# Patient Record
Sex: Female | Born: 1950 | Race: Black or African American | Hispanic: No | Marital: Single | Smoking: Never smoker
Health system: Southern US, Community
[De-identification: ages and names within clinical notes are randomized; demographics above are authoritative.]

## PROBLEM LIST (undated history)

## (undated) DIAGNOSIS — I10 Essential (primary) hypertension: Secondary | ICD-10-CM

## (undated) DIAGNOSIS — E876 Hypokalemia: Secondary | ICD-10-CM

---

## 2005-03-25 ENCOUNTER — Emergency Department (HOSPITAL_COMMUNITY): Admission: EM | Admit: 2005-03-25 | Discharge: 2005-03-25 | Payer: Self-pay | Admitting: Emergency Medicine

## 2005-12-05 IMAGING — CR DG CHEST 2V
2 series · 2 of 2 positions shown · non-contrast
Comparison: None.

CLINICAL DATA: 54-year-old with chills and dizziness.
 CHEST - 2 VIEW:

[view not recorded (1 of 2)]
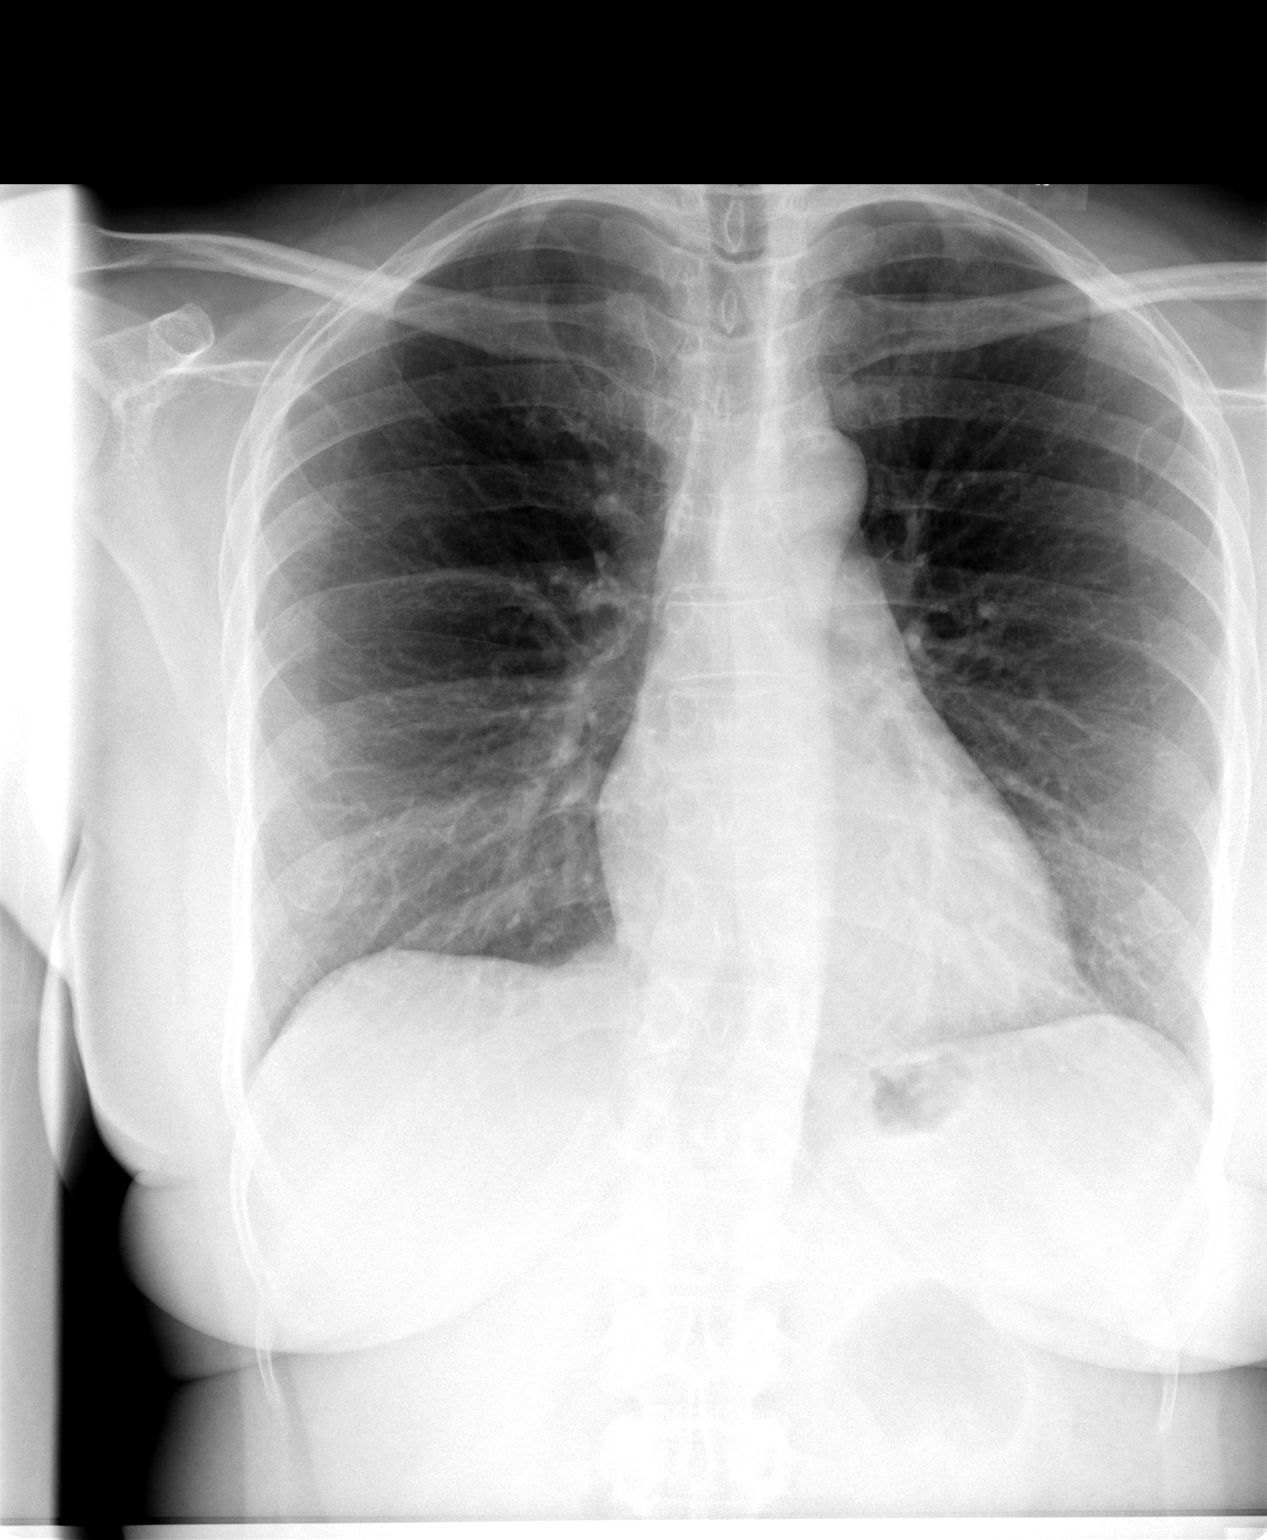

[view not recorded (2 of 2)]
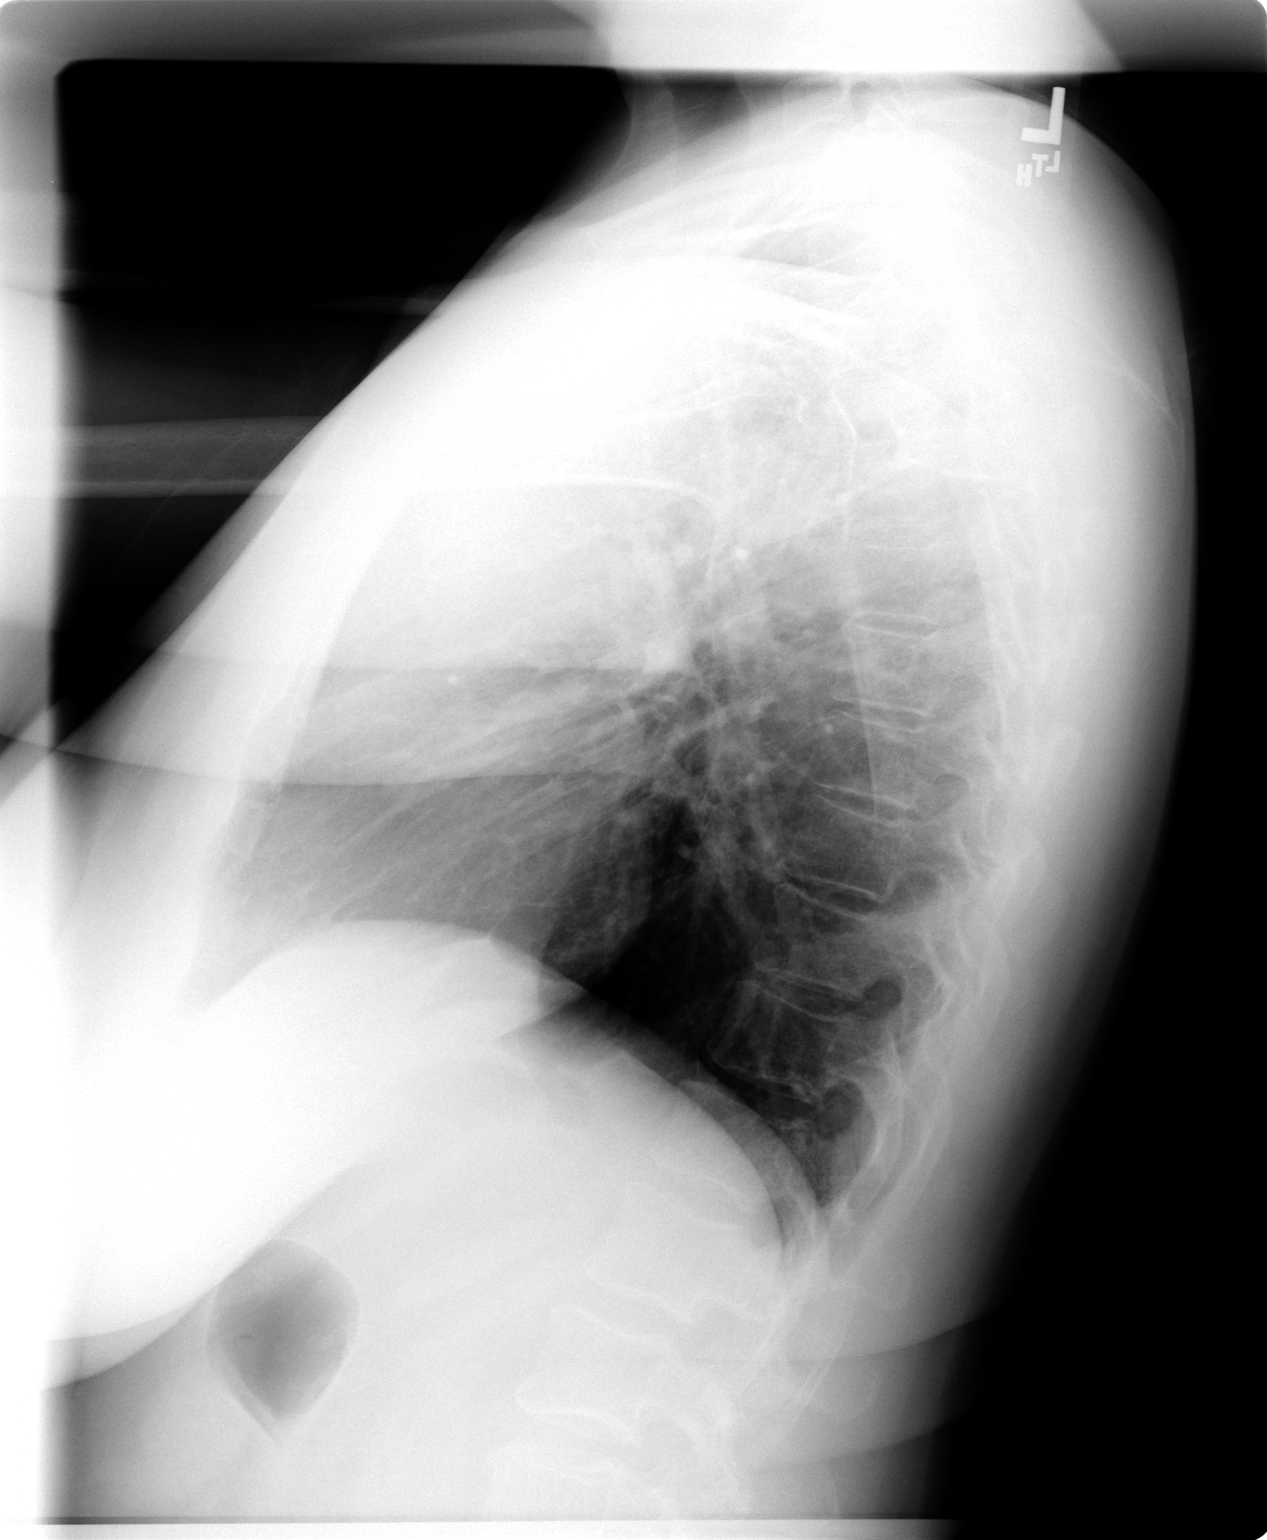

[2 of 2 positions shown; findings below may reference images not displayed]

Cardiomediastinal silhouette is within normal limits.  Lungs are free of focal consolidations and pleural effusions.  Images of the upper abdomen are unremarkable.
IMPRESSION: No evidence for acute abnormality.

## 2020-09-30 ENCOUNTER — Other Ambulatory Visit: Payer: Self-pay

## 2022-09-25 ENCOUNTER — Encounter: Payer: Self-pay | Admitting: Emergency Medicine

## 2022-09-25 ENCOUNTER — Ambulatory Visit
Admission: EM | Admit: 2022-09-25 | Discharge: 2022-09-25 | Disposition: A | Discharge status: Home / Self Care | Payer: Medicare PPO

## 2022-09-25 DIAGNOSIS — H6593 Unspecified nonsuppurative otitis media, bilateral: Secondary | ICD-10-CM

## 2022-09-25 DIAGNOSIS — J069 Acute upper respiratory infection, unspecified: Secondary | ICD-10-CM

## 2022-09-25 DIAGNOSIS — R030 Elevated blood-pressure reading, without diagnosis of hypertension: Secondary | ICD-10-CM | POA: Diagnosis not present

## 2022-09-25 DIAGNOSIS — R42 Dizziness and giddiness: Secondary | ICD-10-CM

## 2022-09-25 HISTORY — DX: Essential (primary) hypertension: I10

## 2022-09-25 HISTORY — DX: Hypokalemia: E87.6

## 2022-09-25 MED ORDER — FLUTICASONE PROPIONATE 50 MCG/ACT NA SUSP: 1.0000 | Freq: Two times a day (BID) | NASAL | 2 refills | Status: AC

## 2022-09-25 MED ORDER — MECLIZINE HCL 25 MG PO TABS: 25.0000 mg | ORAL_TABLET | Freq: Three times a day (TID) | ORAL | 0 refills | Status: AC | PRN

## 2022-09-25 NOTE — ED Triage Notes (Addendum)
Pt reports waking up this morning feeling dizzy. States the dizziness is worse when lying down. Also reports chills last night, and nasal congestion and cough all week. Denies headaches, vision changes and weakness affecting only one side of the body.

## 2022-09-25 NOTE — ED Provider Notes (Signed)
[facility]    CSN: [id number] Arrival date & time: 09/25/22  4315      History   Chief Complaint Chief Complaint  Patient presents with   Dizziness   Fatigue   Chills   Nasal Congestion    HPI Ashley Barry is a 72 y.o. female.   Patient presenting today with about a week of resolving cough, congestion, fatigue and now since yesterday has been having chills, worsening congestion and started this morning when she got up out of bed with room spinning dizziness.  States when she is at rest the dizziness resolves but if she moves her head or changes positions she starts having the room spinning sensation.  She denies any nausea, vomiting, chest pain, shortness of breath, palpitations, visual change, mental status changes, weakness.  She has been taking some naturopathic cold tablets and Tylenol with mild relief of her symptoms.  History of hypertension, and states she has had vertigo once before with an ear infection.    Past Medical History:  Diagnosis Date   Hypertension    Hypokalemia     There are no problems to display for this patient.   History reviewed. No pertinent surgical history.  OB History   No obstetric history on file.      Home Medications    Prior to Admission medications   Medication Sig Start Date End Date Taking? Authorizing Provider  fluticasone (FLONASE) 50 MCG/ACT nasal spray Place 1 spray into both nostrils 2 (two) times daily. 09/25/22  Yes Volney American, PA-C  hydrochlorothiazide (HYDRODIURIL) 25 MG tablet Take 25 mg by mouth. 07/06/22 10/04/22 Yes [provider]  meclizine (ANTIVERT) 25 MG tablet Take 1 tablet (25 mg total) by mouth 3 (three) times daily as needed for dizziness. May cause drowsiness 09/25/22  Yes Volney American, PA-C  potassium chloride (KLOR-CON M10) 10 MEQ tablet Take 1 tablet by mouth daily. 11/07/21  Yes [provider]  Study - CAPTIVA - aspirin 81 mg tablet (PI-Sethi) Chew 1  tablet by mouth daily. Every other day 05/20/10  Yes [provider]    Family History History reviewed. No pertinent family history.  Social History Social History   Tobacco Use   Smoking status: Never   Smokeless tobacco: Never     Allergies   Patient has no known allergies.   Review of Systems Review of Systems PER HPI  Physical Exam Triage Vital Signs ED Triage Vitals  Enc Vitals Group     BP 09/25/22 1030 (!) 201/91     Pulse Rate 09/25/22 1030 80     Resp 09/25/22 1030 17     Temp 09/25/22 1030 99.1 F (37.3 C)     Temp Source 09/25/22 1030 Oral     SpO2 09/25/22 1030 98 %     Weight --      Height --      Head Circumference --      Peak Flow --      Pain Score 09/25/22 1027 0     Pain Loc --      Pain Edu? --      Excl. in [place]? --    Orthostatic VS for the past 24 hrs:  BP- Lying Pulse- Lying BP- Sitting Pulse- Sitting BP- Standing at 0 minutes Pulse- Standing at 0 minutes  09/25/22 1109 187/90 84 174/88 77 185/88 82    Updated Vital Signs BP (!) 201/91 (BP Location: Right Arm)   Pulse 80  Temp 99.1 F (37.3 C) (Oral)   Resp 17   SpO2 98%   Visual Acuity Right Eye Distance:   Left Eye Distance:   Bilateral Distance:    Right Eye Near:   Left Eye Near:    Bilateral Near:     Physical Exam Vitals and nursing note reviewed.  Constitutional:      Appearance: Normal appearance. She is not ill-appearing.  HENT:     Head: Atraumatic.     Ears:     Comments: B/l middle ear effusion    Nose: Rhinorrhea present.     Mouth/Throat:     Mouth: Mucous membranes are moist.     Pharynx: Oropharynx is clear. Posterior oropharyngeal erythema present.  Eyes:     Extraocular Movements: Extraocular movements intact.     Conjunctiva/sclera: Conjunctivae normal.  Cardiovascular:     Rate and Rhythm: Normal rate and regular rhythm.     Heart sounds: Normal heart sounds.  Pulmonary:     Effort: Pulmonary effort is normal.     Breath sounds:  Normal breath sounds.  Musculoskeletal:        General: Normal range of motion.     Cervical back: Normal range of motion and neck supple.  Skin:    General: Skin is warm and dry.  Neurological:     General: No focal deficit present.     Mental Status: She is alert and oriented to person, place, and time.     Cranial Nerves: No cranial nerve deficit.     Motor: No weakness.     Gait: Gait normal.  Psychiatric:        Mood and Affect: Mood normal.        Thought Content: Thought content normal.        Judgment: Judgment normal.      UC Treatments / Results  Labs (all labs ordered are listed, but only abnormal results are displayed) Labs Reviewed - No data to display  EKG   Radiology No results found.  Procedures Procedures (including critical care time)  Medications Ordered in UC Medications - No data to display  Initial Impression / Assessment and Plan / UC Course  I have reviewed the triage vital signs and the nursing notes.  Pertinent labs & imaging results that were available during my care of the patient were reviewed by me and considered in my medical decision making (see chart for details).     Her dizziness is most consistent with vertigo, possibly spurred by middle ear effusion secondary to viral respiratory infection.  Her orthostatic vital signs are reassuring today, however her blood pressure remains significantly elevated throughout her time in clinic so she is instructed to monitor this closely and follow-up with PCP if not improving.  We will treat with meclizine for the vertigo, Flonase for the middle ear effusion and discussed supportive over-the-counter medications, home care and Epley maneuver.  Return for worsening symptoms.  Final Clinical Impressions(s) / UC Diagnoses   Final diagnoses:  Elevated blood pressure reading  Vertigo  Middle ear effusion, bilateral  Viral URI with cough     Discharge Instructions      I believe your dizziness  is coming from positional vertigo.  The meclizine should help as needed and I have also attached a handout on home maneuvers that may help.  This could be being triggered by some fluid pressure in your ears from your recent cold, you may use Coricidin HBP and Flonase twice  daily to help reduce the pressure in your inner ears.  Keep a close eye on your blood pressures as they are significantly elevated today, hopefully these come down over the next day or so but follow-up with your primary care provider if you have any unresolving symptoms    ED Prescriptions     Medication Sig Dispense Auth. Provider   meclizine (ANTIVERT) 25 MG tablet Take 1 tablet (25 mg total) by mouth 3 (three) times daily as needed for dizziness. May cause drowsiness 15 tablet Volney American, PA-C   fluticasone [facility]) 50 MCG/ACT nasal spray Place 1 spray into both nostrils 2 (two) times daily. 16 g Volney American, [place]      PDMP not reviewed this encounter.   Volney American, [place] 09/25/22 2008

## 2022-09-25 NOTE — Discharge Instructions (Signed)
I believe your dizziness is coming from positional vertigo.  The meclizine should help as needed and I have also attached a handout on home maneuvers that may help.  This could be being triggered by some fluid pressure in your ears from your recent cold, you may use Coricidin HBP and Flonase twice daily to help reduce the pressure in your inner ears.  Keep a close eye on your blood pressures as they are significantly elevated today, hopefully these come down over the next day or so but follow-up with your primary care provider if you have any unresolving symptoms

## 2022-09-27 ENCOUNTER — Encounter (HOSPITAL_COMMUNITY): Payer: Self-pay

## 2022-09-27 ENCOUNTER — Emergency Department (HOSPITAL_COMMUNITY)
Admission: EM | Admit: 2022-09-27 | Discharge: 2022-09-28 | Disposition: A | Discharge status: Home / Self Care | Payer: Medicare PPO | Attending: Emergency Medicine | Admitting: Emergency Medicine

## 2022-09-27 DIAGNOSIS — R42 Dizziness and giddiness: Secondary | ICD-10-CM | POA: Diagnosis present

## 2022-09-27 DIAGNOSIS — H8110 Benign paroxysmal vertigo, unspecified ear: Secondary | ICD-10-CM | POA: Diagnosis not present

## 2022-09-27 DIAGNOSIS — I10 Essential (primary) hypertension: Secondary | ICD-10-CM | POA: Diagnosis not present

## 2022-09-27 DIAGNOSIS — Z79899 Other long term (current) drug therapy: Secondary | ICD-10-CM | POA: Diagnosis not present

## 2022-09-27 LAB — BASIC METABOLIC PANEL
Anion gap: 11 (ref 5–15)
BUN: 14 mg/dL (ref 8–23)
CO2: 28 mmol/L (ref 22–32)
Calcium: 9.1 mg/dL (ref 8.9–10.3)
Chloride: 96 mmol/L — ABNORMAL LOW (ref 98–111)
Creatinine, Ser: 1 mg/dL (ref 0.44–1.00)
GFR, Estimated: >60 mL/min (ref >60)
Glucose, Bld: 135 mg/dL — ABNORMAL HIGH (ref 70–99)
Potassium: 3 mmol/L — ABNORMAL LOW (ref 3.5–5.1)
Sodium: 135 mmol/L (ref 135–145)

## 2022-09-27 LAB — CBC WITH DIFFERENTIAL/PLATELET
Abs Immature Granulocytes: 0.01 10*3/uL (ref 0.00–0.07)
Basophils Absolute: 0 10*3/uL (ref 0.0–0.1)
Basophils Relative: 0 %
Eosinophils Absolute: 0 10*3/uL (ref 0.0–0.5)
Eosinophils Relative: 0 %
HCT: 40.7 % (ref 36.0–46.0)
Hemoglobin: 13.2 g/dL (ref 12.0–15.0)
Immature Granulocytes: 0 %
Lymphocytes Relative: 16 %
Lymphs Abs: 0.9 10*3/uL (ref 0.7–4.0)
MCH: 29.1 pg (ref 26.0–34.0)
MCHC: 32.4 g/dL (ref 30.0–36.0)
MCV: 89.6 fL (ref 80.0–100.0)
Monocytes Absolute: 0.4 10*3/uL (ref 0.1–1.0)
Monocytes Relative: 6 %
Neutro Abs: 4.5 10*3/uL (ref 1.7–7.7)
Neutrophils Relative %: 78 %
Platelets: 304 10*3/uL (ref 150–400)
RBC: 4.54 MIL/uL (ref 3.87–5.11)
RDW: 13.2 % (ref 11.5–15.5)
WBC: 5.7 10*3/uL (ref 4.0–10.5)
nRBC: 0 % (ref 0.0–0.2)

## 2022-09-27 NOTE — ED Triage Notes (Addendum)
Pt to ED c/o high bp and nausea. Pt has hx of HTN. Reports taking medications as prescribed. Pt hypertensive in triage. Denies chest pain, denies dizziness.

## 2022-09-28 ENCOUNTER — Emergency Department (HOSPITAL_COMMUNITY): Payer: Medicare PPO

## 2022-09-28 LAB — URINALYSIS, ROUTINE W REFLEX MICROSCOPIC
Bilirubin Urine: NEGATIVE
Glucose, UA: NEGATIVE mg/dL
Hgb urine dipstick: NEGATIVE
Ketones, ur: 5 mg/dL — AB
Leukocytes,Ua: NEGATIVE
Nitrite: NEGATIVE
Protein, ur: NEGATIVE mg/dL
Specific Gravity, Urine: 1.009 (ref 1.005–1.030)
pH: 6 (ref 5.0–8.0)

## 2022-09-28 MED ORDER — AMLODIPINE BESYLATE 5 MG PO TABS: 5.0000 mg | ORAL_TABLET | Freq: Every day | ORAL | 3 refills | Status: AC

## 2022-09-28 MED ORDER — LABETALOL HCL 5 MG/ML IV SOLN
10.0000 mg | Freq: Once | INTRAVENOUS | Status: AC
  Administered 2022-09-28: 10 mg via INTRAVENOUS
  Filled 2022-09-28: qty 4

## 2022-09-28 MED ORDER — POTASSIUM CHLORIDE CRYS ER 20 MEQ PO TBCR
40.0000 meq | EXTENDED_RELEASE_TABLET | Freq: Once | ORAL | Status: AC
  Administered 2022-09-28: 40 meq via ORAL
  Filled 2022-09-28: qty 2

## 2022-09-28 NOTE — Discharge Instructions (Signed)
Please schedule follow-up with primary care for a recheck of your blood pressure within 1 week.

## 2022-09-28 NOTE — ED Notes (Signed)
Patient concerned that her Blood Pressure was elevated again. Patient walked to restroom felt a little unsteady, 1 person assist to restroom. Patient states that she is not dizzy, short winded at this time. RN made aware.

## 2022-09-28 NOTE — ED Provider Notes (Signed)
[facility] EMERGENCY DEPARTMENT Provider Note   CSN: [id number] Arrival date & time: 09/27/22  1807     History  Chief Complaint  Patient presents with   Hypertension   Nausea    Ashley Barry is a 72 y.o. female.  Patient presents to the emergency department for evaluation of elevated blood pressure dizziness.  Patient was seen at urgent care several days ago with URI symptoms and placed on symptomatic treatment.  At that visit her blood pressure was elevated.  Patient reports that she has been experiencing intermittent episodes of feeling dizziness like she is spinning when she moves her head since then.  Symptoms do not last.       Home Medications Prior to Admission medications   Medication Sig Start Date End Date Taking? Authorizing Provider  amLODipine (NORVASC) 5 MG tablet Take 1 tablet (5 mg total) by mouth daily. 09/28/22  Yes Maelynn Moroney, Gwenyth Allegra, MD  fluticasone (FLONASE) 50 MCG/ACT nasal spray Place 1 spray into both nostrils 2 (two) times daily. 09/25/22   Volney American, PA-C  hydrochlorothiazide (HYDRODIURIL) 25 MG tablet Take 25 mg by mouth. 07/06/22 10/04/22  [provider]  meclizine (ANTIVERT) 25 MG tablet Take 1 tablet (25 mg total) by mouth 3 (three) times daily as needed for dizziness. May cause drowsiness 09/25/22   Volney American, PA-C  potassium chloride (KLOR-CON M10) 10 MEQ tablet Take 1 tablet by mouth daily. 11/07/21   [provider]  Study - CAPTIVA - aspirin 81 mg tablet (PI-Sethi) Chew 1 tablet by mouth daily. Every other day 05/20/10   [provider]      Allergies    Patient has no known allergies.    Review of Systems   Review of Systems  Physical Exam Updated Vital Signs BP (!) 164/81   Pulse 62   Temp 98 F (36.7 C)   Resp 19   Ht 5\' 7"  (1.702 m)   Wt 86.2 kg   SpO2 100%   BMI 29.76 kg/m  Physical Exam Vitals and nursing note reviewed.  Constitutional:      General: She is not in acute  distress.    Appearance: She is well-developed.  HENT:     Head: Normocephalic and atraumatic.     Mouth/Throat:     Mouth: Mucous membranes are moist.  Eyes:     General: Vision grossly intact. Gaze aligned appropriately.     Extraocular Movements: Extraocular movements intact.     Conjunctiva/sclera: Conjunctivae normal.  Cardiovascular:     Rate and Rhythm: Normal rate and regular rhythm.     Pulses: Normal pulses.     Heart sounds: Normal heart sounds, S1 normal and S2 normal. No murmur heard.    No friction rub. No gallop.  Pulmonary:     Effort: Pulmonary effort is normal. No respiratory distress.     Breath sounds: Normal breath sounds.  Abdominal:     General: Bowel sounds are normal.     Palpations: Abdomen is soft.     Tenderness: There is no abdominal tenderness. There is no guarding or rebound.     Hernia: No hernia is present.  Musculoskeletal:        General: No swelling.     Cervical back: Full passive range of motion without pain, normal range of motion and neck supple. No spinous process tenderness or muscular tenderness. Normal range of motion.     Right lower leg: No edema.  Left lower leg: No edema.  Skin:    General: Skin is warm and dry.     Capillary Refill: Capillary refill takes less than 2 seconds.     Findings: No ecchymosis, erythema, rash or wound.  Neurological:     General: No focal deficit present.     Mental Status: She is alert and oriented to person, place, and time.     GCS: GCS eye subscore is 4. GCS verbal subscore is 5. GCS motor subscore is 6.     Cranial Nerves: Cranial nerves 2-12 are intact.     Sensory: Sensation is intact.     Motor: Motor function is intact.     Coordination: Coordination is intact.  Psychiatric:        Attention and Perception: Attention normal.        Mood and Affect: Mood normal.        Speech: Speech normal.        Behavior: Behavior normal.     ED Results / Procedures / Treatments   Labs (all labs  ordered are listed, but only abnormal results are displayed) Labs Reviewed  BASIC METABOLIC PANEL - Abnormal; Notable for the following components:      Result Value   Potassium 3.0 (*)    Chloride 96 (*)    Glucose, Bld 135 (*)    All other components within normal limits  URINALYSIS, ROUTINE W REFLEX MICROSCOPIC - Abnormal; Notable for the following components:   Color, Urine STRAW (*)    Ketones, ur 5 (*)    All other components within normal limits  CBC WITH DIFFERENTIAL/PLATELET    EKG None  Radiology CT HEAD WO CONTRAST ( )  Result Date: 09/28/2022 CLINICAL DATA:  5 hour follow-up scan. EXAM: CT HEAD WITHOUT CONTRAST TECHNIQUE: Contiguous axial images were obtained from the base of the skull through the vertex without intravenous contrast. RADIATION DOSE REDUCTION: This exam was performed according to the departmental dose-optimization program which includes automated exposure control, adjustment of the mA and/or kV according to patient size and/or use of iterative reconstruction technique. COMPARISON:  Earlier the same day FINDINGS: Brain: No evidence of acute infarction, hemorrhage, hydrocephalus, extra-axial collection or mass lesion/mass effect. Scattered coarse calcifications from remote insult along the brain surface and at the left caudothalamic groove. The the pattern is nonspecific for an underlying cause. Vascular: No hyperdense vessel or unexpected calcification. Skull: Normal. Negative for fracture or focal lesion. Sinuses/Orbits: No acute finding. IMPRESSION: No acute finding including hemorrhage. Scattered high-density areas are attributed to calcification from nonspecific remote insult. Electronically Signed   By: Tiburcio Pea M.D.   On: 09/28/2022 05:37   DG Lumbar Spine Complete  Result Date: 09/28/2022 CLINICAL DATA:  Fall EXAM: LUMBAR SPINE - COMPLETE 4+ VIEW COMPARISON:  None Available. FINDINGS: There is no evidence of lumbar spine fracture. Alignment is normal.  Intervertebral disc spaces are maintained. IMPRESSION: Negative. Electronically Signed   By: Deatra Robinson M.D.   On: 09/28/2022 00:39   CT HEAD WO CONTRAST ( )  Result Date: 09/28/2022 CLINICAL DATA:  Syncope/presyncope, cerebrovascular cause suspected EXAM: CT HEAD WITHOUT CONTRAST TECHNIQUE: Contiguous axial images were obtained from the base of the skull through the vertex without intravenous contrast. RADIATION DOSE REDUCTION: This exam was performed according to the departmental dose-optimization program which includes automated exposure control, adjustment of the mA and/or kV according to patient size and/or use of iterative reconstruction technique. COMPARISON:  None Available. FINDINGS: Brain: No evidence of large-territorial  acute infarction. No parenchymal hemorrhage. No mass lesion. Curvilinear hyperdensities within the left parietal sulci with underlying trace subarachnoid hemorrhage not excluded. Scattered punctate calcifications throughout the cerebrum likely sequelae of prior infection. No mass effect or midline shift. No hydrocephalus. Basilar cisterns are patent. Vascular: No hyperdense vessel. Skull: No acute fracture or focal lesion. Sinuses/Orbits: Paranasal sinuses and mastoid air cells are clear. The orbits are unremarkable. Other: None. IMPRESSION: Curvilinear hyperdensities within the left parietal sulci with underlying trace subarachnoid hemorrhage not excluded. Consider follow-up CT in 4-6 hours to evaluate for stability. Electronically Signed   By: Iven Finn M.D.   On: 09/28/2022 00:35    Procedures Procedures    Medications Ordered in ED Medications  potassium chloride SA (KLOR-CON M) CR tablet 40 mEq (40 mEq Oral Given 09/28/22 0254)  labetalol (NORMODYNE) injection 10 mg (10 mg Intravenous Given 09/28/22 0423)    ED Course/ Medical Decision Making/ A&P                           Medical Decision Making Amount and/or Complexity of Data Reviewed External Data  Reviewed: labs, ECG and notes. Labs: ordered. Decision-making details documented in ED Course. Radiology: ordered and independent interpretation performed. Decision-making details documented in ED Course. ECG/medicine tests: ordered and independent interpretation performed. Decision-making details documented in ED Course.  Risk Prescription drug management.   Presents to the emergency department for evaluation of multiple problems.  Patient reporting elevated blood pressure that was noted at urgent care several days ago when she was treated for URI.  She is not taking any decongestants or medications that would raise her blood pressure.  Patient now with intermittent episodes of what sounds like positional vertigo.  She is not experiencing symptoms currently.  She has a normal neurologic exam.  Patient underwent CT head and there was some question about hyperdensities that could be blood.  As per recommendation, patient was held and CT was repeated.  This delineated these hyperdensities as calcifications, no acute bleeding.  Patient will be discharged, will increase her blood pressure medications, follow-up with PCP as soon as possible for repeat blood pressure check.        Final Clinical Impression(s) / ED Diagnoses Final diagnoses:  Benign paroxysmal positional vertigo, unspecified laterality  Primary hypertension    Rx / DC Orders ED Discharge Orders          Ordered    amLODipine (NORVASC) 5 MG tablet  Daily        09/28/22 0550              Orpah Greek, MD 09/28/22 [phone]

## 2022-09-29 ENCOUNTER — Emergency Department (HOSPITAL_COMMUNITY): Payer: Medicare PPO

## 2022-09-29 ENCOUNTER — Encounter (HOSPITAL_COMMUNITY): Payer: Self-pay | Admitting: Emergency Medicine

## 2022-09-29 ENCOUNTER — Other Ambulatory Visit: Payer: Self-pay

## 2022-09-29 ENCOUNTER — Emergency Department (HOSPITAL_COMMUNITY)
Admission: EM | Admit: 2022-09-29 | Discharge: 2022-09-29 | Disposition: A | Discharge status: Home / Self Care | Payer: Medicare PPO | Attending: Emergency Medicine | Admitting: Emergency Medicine

## 2022-09-29 ENCOUNTER — Other Ambulatory Visit: Payer: Self-pay | Admitting: Family Medicine

## 2022-09-29 DIAGNOSIS — U071 COVID-19: Secondary | ICD-10-CM | POA: Diagnosis not present

## 2022-09-29 DIAGNOSIS — Z79899 Other long term (current) drug therapy: Secondary | ICD-10-CM | POA: Diagnosis not present

## 2022-09-29 DIAGNOSIS — E876 Hypokalemia: Secondary | ICD-10-CM | POA: Insufficient documentation

## 2022-09-29 DIAGNOSIS — R55 Syncope and collapse: Secondary | ICD-10-CM | POA: Diagnosis present

## 2022-09-29 LAB — URINALYSIS, ROUTINE W REFLEX MICROSCOPIC
Bilirubin Urine: NEGATIVE
Glucose, UA: NEGATIVE mg/dL
Hgb urine dipstick: NEGATIVE
Ketones, ur: NEGATIVE mg/dL
Nitrite: NEGATIVE
Protein, ur: 30 mg/dL — AB
Specific Gravity, Urine: 1.011 (ref 1.005–1.030)
pH: 7 (ref 5.0–8.0)

## 2022-09-29 LAB — CBC
HCT: 40.1 % (ref 36.0–46.0)
Hemoglobin: 13.1 g/dL (ref 12.0–15.0)
MCH: 29.1 pg (ref 26.0–34.0)
MCHC: 32.7 g/dL (ref 30.0–36.0)
MCV: 89.1 fL (ref 80.0–100.0)
Platelets: 319 10*3/uL (ref 150–400)
RBC: 4.5 MIL/uL (ref 3.87–5.11)
RDW: 13.1 % (ref 11.5–15.5)
WBC: 4.4 10*3/uL (ref 4.0–10.5)
nRBC: 0 % (ref 0.0–0.2)

## 2022-09-29 LAB — BASIC METABOLIC PANEL
Anion gap: 10 (ref 5–15)
BUN: 10 mg/dL (ref 8–23)
CO2: 29 mmol/L (ref 22–32)
Calcium: 9.7 mg/dL (ref 8.9–10.3)
Chloride: 96 mmol/L — ABNORMAL LOW (ref 98–111)
Creatinine, Ser: 0.92 mg/dL (ref 0.44–1.00)
GFR, Estimated: >60 mL/min (ref >60)
Glucose, Bld: 148 mg/dL — ABNORMAL HIGH (ref 70–99)
Potassium: 2.8 mmol/L — ABNORMAL LOW (ref 3.5–5.1)
Sodium: 135 mmol/L (ref 135–145)

## 2022-09-29 LAB — RESP PANEL BY RT-PCR (RSV, FLU A&B, COVID)  RVPGX2
Influenza A by PCR: NEGATIVE
Influenza B by PCR: NEGATIVE
Resp Syncytial Virus by PCR: NEGATIVE
SARS Coronavirus 2 by RT PCR: POSITIVE — AB

## 2022-09-29 LAB — CBG MONITORING, ED: Glucose-Capillary: 132 mg/dL — ABNORMAL HIGH (ref 70–99)

## 2022-09-29 MED ORDER — POTASSIUM CHLORIDE 10 MEQ/100ML IV SOLN
10.0000 meq | INTRAVENOUS | Status: AC
  Administered 2022-09-29 (×2): 10 meq via INTRAVENOUS
  Filled 2022-09-29: qty 100

## 2022-09-29 MED ORDER — POTASSIUM CHLORIDE CRYS ER 20 MEQ PO TBCR
40.0000 meq | EXTENDED_RELEASE_TABLET | Freq: Once | ORAL | Status: AC
  Administered 2022-09-29: 40 meq via ORAL
  Filled 2022-09-29: qty 2

## 2022-09-29 MED ORDER — ALBUTEROL SULFATE HFA 108 (90 BASE) MCG/ACT IN AERS
1.0000 | INHALATION_SPRAY | Freq: Once | RESPIRATORY_TRACT | Status: AC
  Administered 2022-09-29: 2 via RESPIRATORY_TRACT
  Filled 2022-09-29: qty 6.7

## 2022-09-29 MED ORDER — BENZONATATE 200 MG PO CAPS: 200.0000 mg | ORAL_CAPSULE | Freq: Three times a day (TID) | ORAL | 0 refills | Status: AC | PRN

## 2022-09-29 NOTE — ED Triage Notes (Signed)
Pt presents with near syncope episode today, diagnosed with vertigo on Monday and prescribed Meclizine, has taken the meds but still having symptoms, exposed to Covid over Christmas holiday.

## 2022-09-29 NOTE — Telephone Encounter (Signed)
Requested by interface surescripts. Provider no longer at practice.  Requested Prescriptions  Refused Prescriptions Disp Refills   meclizine (ANTIVERT) 25 MG tablet [Pharmacy Med Name: MECLIZINE 25 MG TABLET] 15 tablet 0    Sig: TAKE 1 TAB BY MOUTH 3 TIMES DAILY AS NEEDED FOR DIZZINESS. MAY CAUSE DROWSINESS     There is no refill protocol information for this order

## 2022-09-29 NOTE — ED Notes (Signed)
Pt able to walk to restroom with stand by assist. Pt reaching out to walls and rails to stabilize herself

## 2022-09-29 NOTE — Discharge Instructions (Signed)
Your COVID test today was positive.  Also, your potassium level was low.  It is important that you take your potassium tablet daily for the next 5 days.  You will need to have your potassium level rechecked within 1 week.  Your primary care provider can arrange this for you.  Also, you may take Tylenol or ibuprofen if needed for fever or bodyaches.  Rest, drink plenty of fluids.  1 to 2 puffs of the albuterol inhaler every 4-6 hours if needed for shortness of breath or wheezing.  Please wear a mask while around others for the next 5 to 7 days or until you are feeling better.  Return to the emergency department for any new or worsening symptoms.

## 2022-09-29 NOTE — ED Provider Notes (Signed)
[facility] EMERGENCY DEPARTMENT Provider Note   CSN: [id number] Arrival date & time: 09/29/22  1251     History {Add pertinent medical, surgical, social history, OB history to HPI:1} Chief Complaint  Patient presents with   Near Syncope    Ashley Barry is a 72 y.o. female.   Near Syncope Pertinent negatives include no chest pain, no abdominal pain, no headaches and no shortness of breath.       Ashley Barry is a 72 y.o. female medical history of hypertension and hypokalemia (takes diuretic) who presents to the Emergency Department complaining of near syncopal event today.  Was seen at urgent care on 09/25/2022 was symptomatically treated for vertigo she is currently taking meclizine, she continues to have intermittent episodes of dizziness and feels "faint."  Was seen here for same symptoms 2 days ago.  Had reassuring workup.  Felt better yesterday but had recurrent near syncopal episode after moving today.  She endorses slight cough and congestion and states that she was exposed to COVID over the holiday.  She denies any chest pain, abdominal pain, shortness of breath or known fever.    Home Medications Prior to Admission medications   Medication Sig Start Date End Date Taking? Authorizing Provider  amLODipine (NORVASC) 5 MG tablet Take 1 tablet (5 mg total) by mouth daily. 09/28/22   Orpah Greek, MD  fluticasone (FLONASE) 50 MCG/ACT nasal spray Place 1 spray into both nostrils 2 (two) times daily. 09/25/22   Volney American, PA-C  hydrochlorothiazide (HYDRODIURIL) 25 MG tablet Take 25 mg by mouth. 07/06/22 10/04/22  [provider]  meclizine (ANTIVERT) 25 MG tablet Take 1 tablet (25 mg total) by mouth 3 (three) times daily as needed for dizziness. May cause drowsiness 09/25/22   Volney American, PA-C  potassium chloride (KLOR-CON M10) 10 MEQ tablet Take 1 tablet by mouth daily. 11/07/21   [provider]  Study - CAPTIVA - aspirin 81 mg  tablet (PI-Sethi) Chew 1 tablet by mouth daily. Every other day 05/20/10   [provider]      Allergies    Patient has no known allergies.    Review of Systems   Review of Systems  Constitutional:  Negative for appetite change, chills and fever.  HENT:  Positive for congestion. Negative for ear pain, sore throat and trouble swallowing.   Respiratory:  Negative for cough and shortness of breath.   Cardiovascular:  Positive for near-syncope. Negative for chest pain and leg swelling.  Gastrointestinal:  Negative for abdominal pain, diarrhea, nausea and vomiting.  Genitourinary:  Negative for dysuria.  Musculoskeletal:  Negative for arthralgias, myalgias, neck pain and neck stiffness.  Skin:  Negative for rash.  Neurological:  Positive for dizziness and light-headedness. Negative for seizures, weakness and headaches.    Physical Exam Updated Vital Signs BP (!) 156/86 (BP Location: Left Arm)   Pulse 77   Temp 99.2 F (37.3 C) (Oral)   Resp 20   Ht 5\' 7"  (1.702 m)   Wt 86.2 kg   SpO2 97%   BMI 29.76 kg/m  Physical Exam Vitals and nursing note reviewed.  Constitutional:      General: She is not in acute distress.    Appearance: Normal appearance. She is not ill-appearing or toxic-appearing.  HENT:     Right Ear: Tympanic membrane and ear canal normal.     Left Ear: Tympanic membrane and ear canal normal.     Nose: Nose normal.  Mouth/Throat:     Mouth: Mucous membranes are moist.     Pharynx: Oropharynx is clear.  Cardiovascular:     Rate and Rhythm: Normal rate and regular rhythm.     Pulses: Normal pulses.  Pulmonary:     Effort: Pulmonary effort is normal. No respiratory distress.  Abdominal:     General: There is no distension.     Palpations: Abdomen is soft.     Tenderness: There is no abdominal tenderness.  Musculoskeletal:     Cervical back: Normal range of motion. No rigidity.     Right lower leg: No edema.     Left lower leg: No edema.   Lymphadenopathy:     Cervical: No cervical adenopathy.  Skin:    General: Skin is warm.     Capillary Refill: Capillary refill takes less than 2 seconds.     Findings: No rash.  Neurological:     General: No focal deficit present.     Mental Status: She is alert.     Sensory: No sensory deficit.     Motor: No weakness.     ED Results / Procedures / Treatments   Labs (all labs ordered are listed, but only abnormal results are displayed) Labs Reviewed  RESP PANEL BY RT-PCR (RSV, FLU A&B, COVID)  RVPGX2 - Abnormal; Notable for the following components:      Result Value   SARS Coronavirus 2 by RT PCR POSITIVE (*)    All other components within normal limits  BASIC METABOLIC PANEL - Abnormal; Notable for the following components:   Potassium 2.8 (*)    Chloride 96 (*)    Glucose, Bld 148 (*)    All other components within normal limits  URINALYSIS, ROUTINE W REFLEX MICROSCOPIC - Abnormal; Notable for the following components:   Protein, ur 30 (*)    Leukocytes,Ua TRACE (*)    Bacteria, UA MANY (*)    All other components within normal limits  CBG MONITORING, ED - Abnormal; Notable for the following components:   Glucose-Capillary 132 (*)    All other components within normal limits  CBC    EKG None  Radiology CT HEAD WO CONTRAST (5MM)  Result Date: 09/28/2022 CLINICAL DATA:  5 hour follow-up scan. EXAM: CT HEAD WITHOUT CONTRAST TECHNIQUE: Contiguous axial images were obtained from the base of the skull through the vertex without intravenous contrast. RADIATION DOSE REDUCTION: This exam was performed according to the departmental dose-optimization program which includes automated exposure control, adjustment of the mA and/or kV according to patient size and/or use of iterative reconstruction technique. COMPARISON:  Earlier the same day FINDINGS: Brain: No evidence of acute infarction, hemorrhage, hydrocephalus, extra-axial collection or mass lesion/mass effect. Scattered  coarse calcifications from remote insult along the brain surface and at the left caudothalamic groove. The the pattern is nonspecific for an underlying cause. Vascular: No hyperdense vessel or unexpected calcification. Skull: Normal. Negative for fracture or focal lesion. Sinuses/Orbits: No acute finding. IMPRESSION: No acute finding including hemorrhage. Scattered high-density areas are attributed to calcification from nonspecific remote insult. Electronically Signed   By: Jorje Guild M.D.   On: 09/28/2022 05:37   DG Lumbar Spine Complete  Result Date: 09/28/2022 CLINICAL DATA:  Fall EXAM: LUMBAR SPINE - COMPLETE 4+ VIEW COMPARISON:  None Available. FINDINGS: There is no evidence of lumbar spine fracture. Alignment is normal. Intervertebral disc spaces are maintained. IMPRESSION: Negative. Electronically Signed   By: Ulyses Jarred M.D.   On: 09/28/2022 00:39  CT HEAD WO CONTRAST ( )  Result Date: 09/28/2022 CLINICAL DATA:  Syncope/presyncope, cerebrovascular cause suspected EXAM: CT HEAD WITHOUT CONTRAST TECHNIQUE: Contiguous axial images were obtained from the base of the skull through the vertex without intravenous contrast. RADIATION DOSE REDUCTION: This exam was performed according to the departmental dose-optimization program which includes automated exposure control, adjustment of the mA and/or kV according to patient size and/or use of iterative reconstruction technique. COMPARISON:  None Available. FINDINGS: Brain: No evidence of large-territorial acute infarction. No parenchymal hemorrhage. No mass lesion. Curvilinear hyperdensities within the left parietal sulci with underlying trace subarachnoid hemorrhage not excluded. Scattered punctate calcifications throughout the cerebrum likely sequelae of prior infection. No mass effect or midline shift. No hydrocephalus. Basilar cisterns are patent. Vascular: No hyperdense vessel. Skull: No acute fracture or focal lesion. Sinuses/Orbits: Paranasal  sinuses and mastoid air cells are clear. The orbits are unremarkable. Other: None. IMPRESSION: Curvilinear hyperdensities within the left parietal sulci with underlying trace subarachnoid hemorrhage not excluded. Consider follow-up CT in 4-6 hours to evaluate for stability. Electronically Signed   By: Tish Frederickson M.D.   On: 09/28/2022 00:35    Procedures Procedures  {Document cardiac monitor, telemetry assessment procedure when appropriate:1}  Medications Ordered in ED Medications - No data to display  ED Course/ Medical Decision Making/ A&P                           Medical Decision Making Amount and/or Complexity of Data Reviewed Labs: ordered. Radiology: ordered.   ***  {Document critical care time when appropriate:1} {Document review of labs and clinical decision tools ie heart score, Chads2Vasc2 etc:1}  {Document your independent review of radiology images, and any outside records:1} {Document your discussion with family members, caretakers, and with consultants:1} {Document social determinants of health affecting pt's care:1} {Document your decision making why or why not admission, treatments were needed:1} Final Clinical Impression(s) / ED Diagnoses Final diagnoses:  None    Rx / DC Orders ED Discharge Orders     None

## 2023-04-28 LAB — AMB RESULTS CONSOLE CBG: Glucose: 107

## 2023-05-09 ENCOUNTER — Encounter: Payer: Self-pay | Admitting: *Deleted

## 2023-05-09 NOTE — Progress Notes (Signed)
Pt attended 04/28/2023 screening event where her b/p was 133/79 and her blood sugar was 107. At the event, the pt confirmed her PCP was Dr. Alden Hipp, at [facility], and pt did not indicate any SDOH insecurities. Chart review encounters confirm pt was most recently seen by her PCP on 02/28/23 and she has a future appt with Dr. Carnella Guadalajara for her AWV on 08/31/23. Chart review also indicates pt has had ongoing ortho, neurology, and GI specialty support care over the past 12 months. No additional health equity team support indicated at this time.
